# Patient Record
Sex: Female | Born: 1955 | Race: White | Hispanic: No | Marital: Single
Health system: Southern US, Community
[De-identification: ages and names within clinical notes are randomized; demographics above are authoritative.]

---

## 2004-04-15 ENCOUNTER — Other Ambulatory Visit: Payer: Self-pay

## 2004-04-16 ENCOUNTER — Other Ambulatory Visit: Payer: Self-pay

## 2004-05-28 ENCOUNTER — Other Ambulatory Visit: Payer: Self-pay

## 2004-07-07 ENCOUNTER — Other Ambulatory Visit: Payer: Self-pay

## 2004-07-07 ENCOUNTER — Inpatient Hospital Stay: Payer: Self-pay | Admitting: Unknown Physician Specialty

## 2004-08-06 ENCOUNTER — Ambulatory Visit: Payer: Self-pay | Admitting: Unknown Physician Specialty

## 2004-08-24 ENCOUNTER — Ambulatory Visit: Payer: Self-pay | Admitting: Unknown Physician Specialty

## 2004-09-23 ENCOUNTER — Ambulatory Visit: Payer: Self-pay | Admitting: Unknown Physician Specialty

## 2014-10-02 LAB — CBC
HCT: 42.6 % (ref 35.0–47.0)
HGB: 14.3 g/dL (ref 12.0–16.0)
MCH: 31.8 pg (ref 26.0–34.0)
MCHC: 33.6 g/dL (ref 32.0–36.0)
MCV: 95 fL (ref 80–100)
Platelet: 314 10*3/uL (ref 150–440)
RBC: 4.5 10*6/uL (ref 3.80–5.20)
RDW: 11.8 % (ref 11.5–14.5)
WBC: 7.7 10*3/uL (ref 3.6–11.0)

## 2014-10-02 LAB — URINALYSIS, COMPLETE
BILIRUBIN, UR: NEGATIVE
Blood: NEGATIVE
GLUCOSE, UR: NEGATIVE mg/dL (ref 0–75)
Hyaline Cast: 2
Leukocyte Esterase: NEGATIVE
Nitrite: NEGATIVE
Ph: 5 (ref 4.5–8.0)
Protein: NEGATIVE
RBC,UR: 1 /HPF (ref 0–5)
SPECIFIC GRAVITY: 1.019 (ref 1.003–1.030)
Squamous Epithelial: <1
WBC UR: 4 /HPF (ref 0–5)

## 2014-10-02 LAB — DRUG SCREEN, URINE

## 2014-10-02 LAB — COMPREHENSIVE METABOLIC PANEL
ALBUMIN: 4.4 g/dL (ref 3.4–5.0)
ALK PHOS: 102 U/L
ALT: 47 U/L
Anion Gap: 8 (ref 7–16)
BUN: 12 mg/dL (ref 7–18)
Bilirubin,Total: 0.5 mg/dL (ref 0.2–1.0)
CHLORIDE: 104 mmol/L (ref 98–107)
CO2: 27 mmol/L (ref 21–32)
CREATININE: 0.96 mg/dL (ref 0.60–1.30)
Calcium, Total: 9.2 mg/dL (ref 8.5–10.1)
EGFR (African American): >60
Glucose: 130 mg/dL — ABNORMAL HIGH (ref 65–99)
OSMOLALITY: 279 (ref 275–301)
Potassium: 3.6 mmol/L (ref 3.5–5.1)
SGOT(AST): 26 U/L (ref 15–37)
SODIUM: 139 mmol/L (ref 136–145)
Total Protein: 7.6 g/dL (ref 6.4–8.2)

## 2014-10-02 LAB — ETHANOL

## 2014-10-02 LAB — SALICYLATE LEVEL

## 2014-10-02 LAB — ACETAMINOPHEN LEVEL: Acetaminophen: <2 ug/mL

## 2014-10-03 ENCOUNTER — Inpatient Hospital Stay: Payer: Self-pay | Admitting: Psychiatry

## 2014-10-24 LAB — LITHIUM LEVEL: Lithium: 0.85 mmol/L
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
## 2015-02-22 NOTE — Discharge Summary (Signed)
PATIENT NAME:  Morgan Jordan, Morgan Jordan MR#:  [id number] DATE OF BIRTH:  1956-07-10  DATE OF ADMISSION:  10/03/2014 DATE OF DISCHARGE:  10/31/2014  CONSULTING PHYSICIAN: Audery AmelJohn T. Clapacs, M.D.   HOSPITAL COURSE: See dictated history and physical. A 59 year old woman with a history of recurrent depression and chronic depression admitted to the hospital reporting severely depressed mood, severe generalized anxiety and active suicidal thoughts. The patient was treated with medication initially with minimal improvement. Continued to report having active suicidal ruminations and affect appeared to be extremely nervous and depressed. Very withdrawn, very limited psychomotor activity. The patient had a past history of response to ECT and an ECT consult was requested. The patient requested beginning ECT and the pros and cons were discussed. As of today she has had 8 right unilateral ECT treatments. She has tolerated the procedure well with no major side effects. Minimal short-term memory loss or confusion. As of the last couple treatments, the patient has shown a clear improvement. Affect has been brighter. Mood has been acknowledged to be better. She gets out of her room and goes to group and interacts with other patients in an appropriate manner. She speaks more openly and in a more calm manner to staff. The patient herself still describes herself as feeling depressed and anxious, but admits her suicidal thoughts are not intense and she does not have any plan or intent of harming herself. Her medications have been adjusted a couple of times during the hospital stay. The patient felt higher doses of quetiapine were causing excessive side effects and not helping with her mood and after discussing pros and cons she was switched back to lithium as well as her Effexor, which has been titrated up to 150 mg a day. After some hesitation, I have allowed her to be restarted on standing doses of Xanax. She has been counseled about the  dangers of excessive Xanax use and the dangers of abuse of this medication, but clearly feels that it helps her mood and anxiety. It does seem like it made a difference in allowing her to be more interactive on the ward. At this point, the patient is not reporting active suicidal intent or plan. It has been recommended to her that she get maintenance ECT treatments. Unfortunately, there are major practical hurdles. The patient does not live in our county and does not have any transport available to bring her for ECT treatments here. I have suggested to her that she consider going to [facility], but she has declined to allow us to investigate this because of her feeling that she had a bad experience at [facility]. Therefore, at this point, we have no plan for maintenance ECT, but have made it clear to the patient that if transport becomes available she can get in touch with us if we want to discuss that as a maintenance treatment. She does have an appointment arranged to see a psychiatrist in her local community for follow-up outpatient care and does have arrangements to continue seeing her therapist.   DISCHARGE MEDICATIONS: Losartan 50 mg once a day, quetiapine 200 mg at night, alprazolam 0.5 mg 4 times a day, venlafaxine extended release 150 mg once a day, lithium carbonate extended release 450 mg twice a day.   LABORATORY RESULTS: Lithium level done on January 1st was 0.85. Admission labs included a chemistry panel with no significant abnormalities. Alcohol negative. Drug screen negative. CBC all unremarkable. Glucose stable.   MENTAL STATUS EXAMINATION AT DISCHARGE: Disheveled woman, looks her stated age,  cooperative with the interview. Good eye contact. More normal psychomotor activity. Speech is normal in tone, still decreased in amount. Affect is smiling and reactive. Mood is still stated as nervous. Thoughts are slow but lucid. No evidence of delusions. Denies hallucinations. Denies suicidal or homicidal intent or  plan. She is alert and oriented x4. Can repeat 3 words immediately, remembered 3 at three minutes. Judgment and insight intact. Normal intelligence.   DISPOSITION: Discharged home. Follow up with outpatient therapy and medication management.   DIAGNOSIS, PRINCIPAL AND PRIMARY:  AXIS I: Major depression, severe, recurrent.   SECONDARY DIAGNOSES: AXIS I: Generalized anxiety disorder, severe, versus obsessive-compulsive disorder.  AXIS II: Deferred.  AXIS III: High blood pressure.  ____________________________ Audery Amel, MD jtc:sb D: 10/31/2014 11:19:18 ET T: 10/31/2014 11:40:21 ET JOB#: [id number]  cc: Audery Amel, MD, <Dictator> Audery Amel MD ELECTRONICALLY SIGNED 11/18/2014 20:00

## 2016-01-05 IMAGING — CR DG CHEST 1V PORT
1 series · 1 of 1 positions shown · non-contrast
Comparison: None.

CLINICAL DATA: Cough and weakness. Patient just released from [facility].

EXAM:
PORTABLE CHEST - 1 VIEW

[ap]
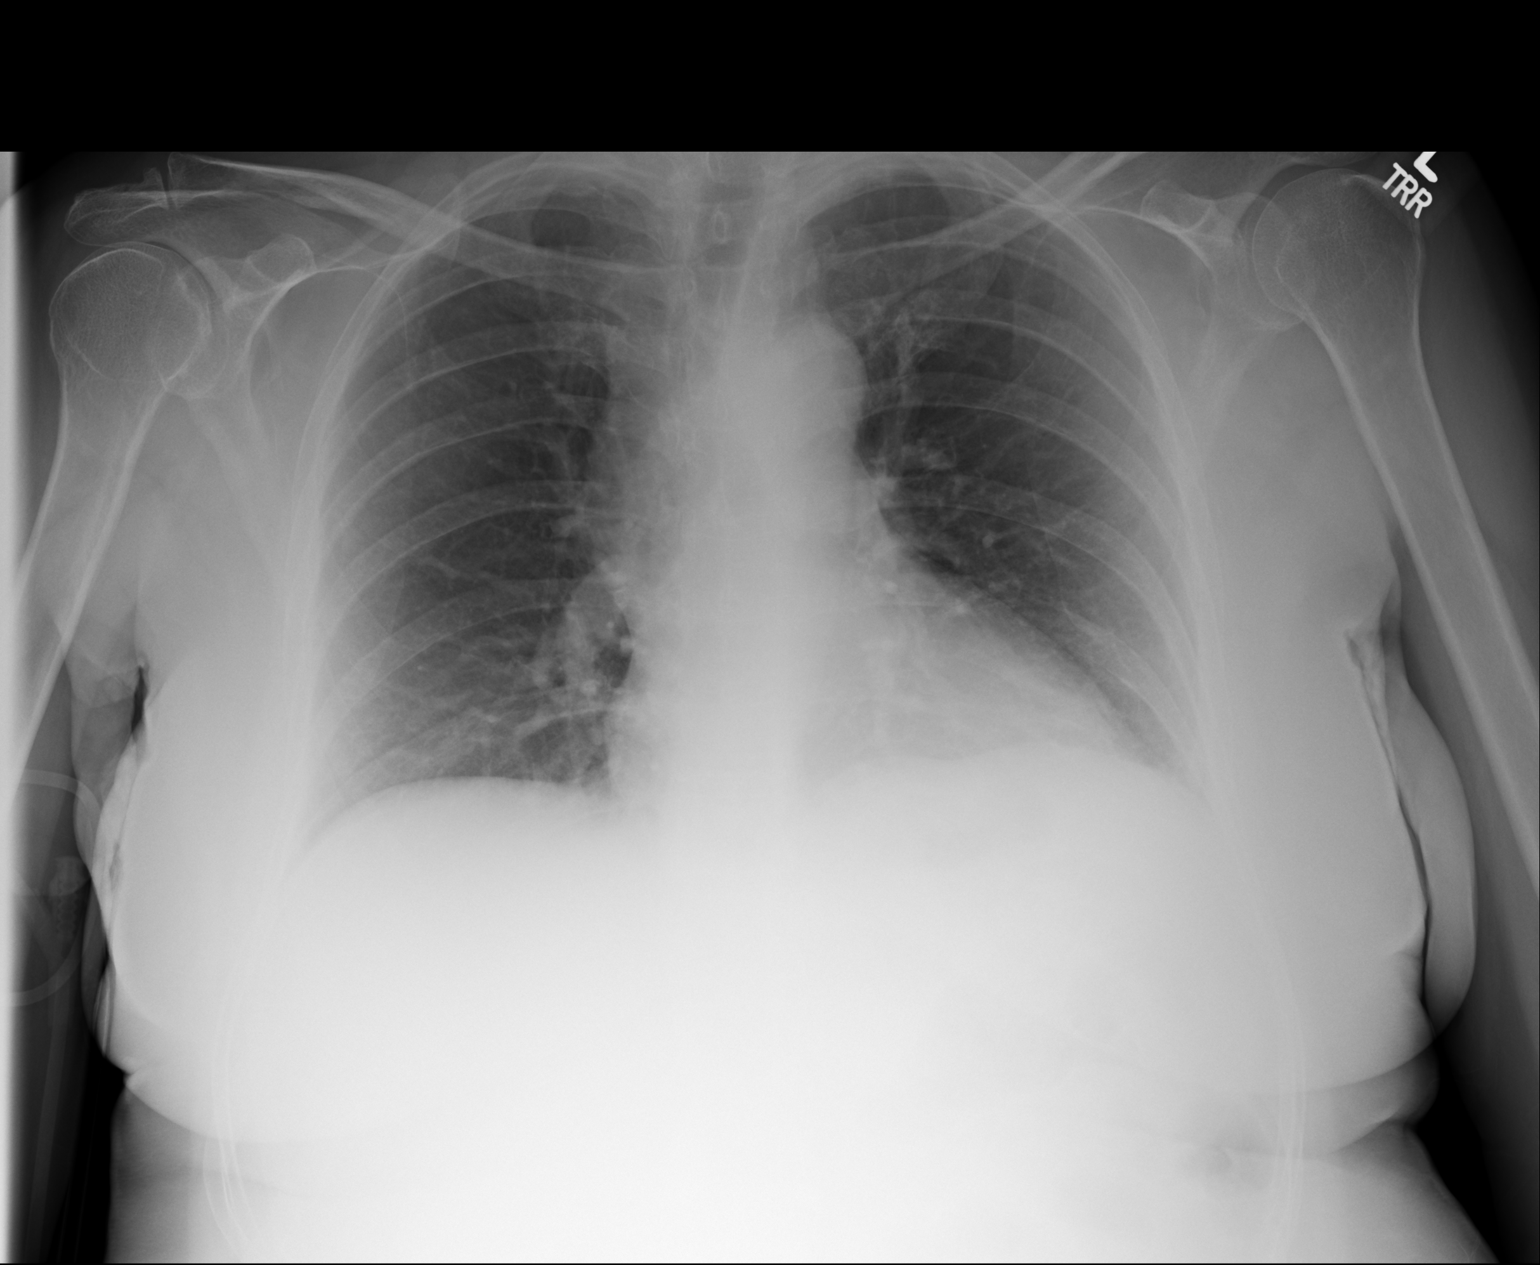

[1 of 1 positions shown; findings below may reference images not displayed]

FINDINGS: The heart size and mediastinal contours are within normal limits.
Both lungs are clear. The visualized skeletal structures are
unremarkable.
IMPRESSION: No active disease.

## 2020-07-24 DEATH — deceased
